# Patient Record
Sex: Male | Born: 1975 | Race: White | Hispanic: No | Marital: Single
Health system: Southern US, Community
[De-identification: ages and names within clinical notes are randomized; demographics above are authoritative.]

---

## 1998-12-26 ENCOUNTER — Encounter: Payer: Self-pay | Admitting: Family Medicine

## 1998-12-26 ENCOUNTER — Ambulatory Visit (HOSPITAL_COMMUNITY): Admission: RE | Admit: 1998-12-26 | Discharge: 1998-12-26 | Payer: Self-pay | Admitting: Family Medicine

## 2004-10-05 ENCOUNTER — Ambulatory Visit (HOSPITAL_COMMUNITY): Admission: RE | Admit: 2004-10-05 | Discharge: 2004-10-05 | Payer: Self-pay | Admitting: Family Medicine

## 2008-10-14 ENCOUNTER — Emergency Department (HOSPITAL_COMMUNITY): Admission: EM | Admit: 2008-10-14 | Discharge: 2008-10-14 | Payer: Self-pay | Admitting: Emergency Medicine

## 2008-11-20 DIAGNOSIS — K625 Hemorrhage of anus and rectum: Secondary | ICD-10-CM

## 2008-11-20 DIAGNOSIS — M549 Dorsalgia, unspecified: Secondary | ICD-10-CM | POA: Insufficient documentation

## 2008-11-21 ENCOUNTER — Ambulatory Visit: Payer: Self-pay | Admitting: Gastroenterology

## 2009-06-11 ENCOUNTER — Encounter (INDEPENDENT_AMBULATORY_CARE_PROVIDER_SITE_OTHER): Payer: Self-pay | Admitting: *Deleted

## 2009-07-10 ENCOUNTER — Ambulatory Visit: Payer: Self-pay | Admitting: Gastroenterology

## 2009-07-10 ENCOUNTER — Encounter (INDEPENDENT_AMBULATORY_CARE_PROVIDER_SITE_OTHER): Payer: Self-pay | Admitting: *Deleted

## 2009-07-15 ENCOUNTER — Encounter (INDEPENDENT_AMBULATORY_CARE_PROVIDER_SITE_OTHER): Payer: Self-pay | Admitting: *Deleted

## 2009-07-15 LAB — CONVERTED CEMR LAB
ALT: 31 units/L (ref 0–53)
AST: 24 units/L (ref 0–37)
Albumin: 4.4 g/dL (ref 3.5–5.2)
Alkaline Phosphatase: 94 units/L (ref 39–117)
BUN: 20 mg/dL (ref 6–23)
Basophils Absolute: 0 10*3/uL (ref 0.0–0.1)
Basophils Relative: 0.3 % (ref 0.0–3.0)
CO2: 29 meq/L (ref 19–32)
Calcium: 9.4 mg/dL (ref 8.4–10.5)
Chloride: 107 meq/L (ref 96–112)
Creatinine, Ser: 1 mg/dL (ref 0.4–1.5)
Eosinophils Absolute: 0.3 10*3/uL (ref 0.0–0.7)
Eosinophils Relative: 3.4 % (ref 0.0–5.0)
GFR calc non Af Amer: 94.47 mL/min (ref >60)
Glucose, Bld: 91 mg/dL (ref 70–99)
HCT: 48.5 % (ref 39.0–52.0)
Hemoglobin: 17 g/dL (ref 13.0–17.0)
Lymphocytes Relative: 30.2 % (ref 12.0–46.0)
Lymphs Abs: 2.3 10*3/uL (ref 0.7–4.0)
MCHC: 35 g/dL (ref 30.0–36.0)
MCV: 92.5 fL (ref 78.0–100.0)
Monocytes Absolute: 0.8 10*3/uL (ref 0.1–1.0)
Monocytes Relative: 10.1 % (ref 3.0–12.0)
Neutro Abs: 4.4 10*3/uL (ref 1.4–7.7)
Neutrophils Relative %: 56 % (ref 43.0–77.0)
Platelets: 208 10*3/uL (ref 150.0–400.0)
Potassium: 4.4 meq/L (ref 3.5–5.1)
RBC: 5.24 M/uL (ref 4.22–5.81)
RDW: 14.3 % (ref 11.5–14.6)
Sed Rate: 2 mm/hr (ref 0–22)
Sodium: 142 meq/L (ref 135–145)
TSH: 0.78 microintl units/mL (ref 0.35–5.50)
Total Bilirubin: 0.6 mg/dL (ref 0.3–1.2)
Total Protein: 7 g/dL (ref 6.0–8.3)
WBC: 7.8 10*3/uL (ref 4.5–10.5)

## 2009-07-21 ENCOUNTER — Telehealth: Payer: Self-pay | Admitting: Gastroenterology

## 2009-08-24 ENCOUNTER — Ambulatory Visit: Payer: Self-pay | Admitting: Gastroenterology

## 2009-08-24 ENCOUNTER — Encounter (INDEPENDENT_AMBULATORY_CARE_PROVIDER_SITE_OTHER): Payer: Self-pay | Admitting: *Deleted

## 2009-08-24 ENCOUNTER — Telehealth: Payer: Self-pay | Admitting: Gastroenterology

## 2009-08-27 ENCOUNTER — Telehealth: Payer: Self-pay | Admitting: Gastroenterology

## 2009-09-03 ENCOUNTER — Emergency Department (HOSPITAL_COMMUNITY): Admission: EM | Admit: 2009-09-03 | Discharge: 2009-09-03 | Payer: Self-pay | Admitting: Emergency Medicine

## 2009-11-23 ENCOUNTER — Ambulatory Visit (HOSPITAL_COMMUNITY): Admission: RE | Admit: 2009-11-23 | Discharge: 2009-11-23 | Payer: Self-pay | Admitting: Psychiatry

## 2009-11-23 ENCOUNTER — Emergency Department (HOSPITAL_COMMUNITY): Admission: EM | Admit: 2009-11-23 | Discharge: 2009-11-24 | Payer: Self-pay | Admitting: Emergency Medicine

## 2009-11-24 ENCOUNTER — Ambulatory Visit: Payer: Self-pay | Admitting: Psychiatry

## 2009-11-24 ENCOUNTER — Inpatient Hospital Stay (HOSPITAL_COMMUNITY): Admission: RE | Admit: 2009-11-24 | Discharge: 2009-11-27 | Payer: Self-pay | Admitting: Psychiatry

## 2010-03-30 NOTE — Letter (Signed)
**Note De-Identified Vavra Obfuscation** Summary: Results Letter  [facility]  [address] [place], [place] [address]   Phone: [phone]  Fax: [phone]        Jul 15, 2009 MRN: [id number]    [facility] [place] [address] [place], [place]  [address]    Dear Mr. Drew Osborne,  We have been unable to reach you by phone regarding your lab results.  Please call at your earliest convenience.  Thank you for your time.        Sincerely,  Chales Abrahams CMA (AAMA)  This letter has been electronically signed by your physician.

## 2010-03-30 NOTE — Assessment & Plan Note (Signed)
**Note De-Identified Northrop Obfuscation** History of Present Illness Visit Type: consult  Primary GI MD: Rob Bunting MD Primary Provider: Rudi Heap, MD Requesting Provider: Rudi Heap, MD Chief Complaint: BRB in stool  History of Present Illness:     pleasant 35 year old man who has red rectal bleeding for 1 1/2 years.  With every BM.  He is not usually constipated, usually has diarrhea.   He had recent labs at PCP office and tells me he was not anemic.    Overall weight has been stable over this time.    No eye findings, no rashes, bumps on skin, shins.   Had some hematemsis 2-3 weeks ago. He will often vomit and gag in the morning. .  Can gag when brushing his teeth.  take vicodin 1-2 time a week.  very rare NSAIDs.              Current Medications (verified): 1)  Vicodin Es 7.5-750 Mg Tabs (Hydrocodone-Acetaminophen) .... Prn 2)  Propranolol Hcl 20 Mg Tabs (Propranolol Hcl) .... 2  Tablet Three Times A Day 3)  Klonopin 0.5 Mg Tabs (Clonazepam) .... One Tablet At Bedtime 4)  Zoloft 50 Mg Tabs (Sertraline Hcl) .... One Tablet By Mouth Once Daily  Allergies (verified): No Known Drug Allergies  Past History:  Past Medical History:  Hypertension ACK PAIN, CHRONIC (ICD-724.5) RECTAL BLEEDING (ICD-569.3) Anxiety Arthritis  Past Surgical History: mastoidectomy 35 years old  Family History: no colon cancer, colon polyps  Social History: Patient currently smokes.  Alcohol Use - yes Illicit Drug Use - denies He is single One daughter  Review of Systems       Pertinent positive and negative review of systems were noted in the above HPI and GI specific review of systems.  All other review of systems was otherwise negative.    Vital Signs:  Patient profile:   35 year old male Height:      74 inches Weight:      205 pounds BMI:     26.42 BSA:     2.20 Pulse rate:   88 / minute Pulse rhythm:   regular BP sitting:   164 / 92  (left arm) Cuff size:   regular  Vitals Entered By: Ok Anis CMA (Jul 10, 2009 2:05 PM)  Physical Exam  Additional Exam:  Constitutional: Very blunted affect Psychiatric: alert and oriented times 3 Eyes: extraocular movements intact, fairly pinpoint pupils Mouth: oropharynx moist, no lesions Neck: supple, no lymphadenopathy Cardiovascular: heart regular rate and rythm Lungs: CTA bilaterally Abdomen: soft, non-tender, non-distended, no obvious ascites, no peritoneal signs, normal bowel sounds Extremities: no lower extremity edema bilaterally Skin: no lesions on visible extremities    Impression & Recommendations:  Problem # 1:  recent rectal bleeding, recent hematemesis he'll need both upper and lower endoscopic procedures to evaluate these complaints. He has a fairly blunted affect and was slow to answer questions with pinpoint pupils. He denied illicit drug use I'm not so sure. He does take Vicodin but says this is on once or twice a week. We'll get a basic set of labs including a CBC, complete metabolic profile, sedimentation rate. He does not at all appear to be anemic and is not hemodynamically compromised.  Other Orders: TLB-CBC Platelet - w/Differential (85025-CBCD) TLB-CMP (Comprehensive Metabolic Pnl) (80053-COMP) TLB-TSH (Thyroid Stimulating Hormone) (84443-TSH) TLB-Sedimentation Rate (ESR) (85652-ESR)  Patient Instructions: 1)  You will be scheduled to have a colonoscopy and EGD. 2)  You will get lab test(s) done today (cbc, **Note De-Identified Mosquera Obfuscation** cmet, esr, tsh). 3)  A copy of this information will be sent to Dr. Christell Constant 4)  The medication list was reviewed and reconciled.  All changed / newly prescribed medications were explained.  A complete medication list was provided to the patient / caregiver.  Appended Document: Orders Update/movi    Clinical Lists Changes  Medications: Added new medication of MOVIPREP 100 GM  SOLR (PEG-KCL-NACL-NASULF-NA ASC-C) As per prep instructions. - Signed Rx of MOVIPREP 100 GM  SOLR (PEG-KCL-NACL-NASULF-NA  ASC-C) As per prep instructions.;  #1 x 0;  Signed;  Entered by: Chales Abrahams CMA (AAMA);  Authorized by: Rachael Fee MD;  Method used: [facility] [phone] [address] [place], [place], [place]  [address], Ph: [phone] or [phone], Fax: [phone] Orders: Added new Test order of Colon/Endo (Colon/Endo) - Signed    Prescriptions: MOVIPREP 100 GM  SOLR (PEG-KCL-NACL-NASULF-NA ASC-C) As per prep instructions.  #1 x 0   Entered by:   Chales Abrahams CMA (AAMA)   Authorized by:   Rachael Fee MD   Signed by:   Chales Abrahams CMA (AAMA) on 07/10/2009   Method used:   [facility]ronically to        [facility] [phone][facility] (retail)       [address]       [place], [place]  [address]       Ph: [phone] or [phone]       Fax: [phone]   RxID:   [id number]

## 2010-03-30 NOTE — Progress Notes (Signed)
**Note De-Identified Fennel Obfuscation** Summary: Lab results  Phone Note Call from Patient Call back at Home Phone [phone]   Caller: Patient Call For: Dr. Christella Hartigan Reason for Call: Lab or Test Results Summary of Call: Calling about lab results Initial call taken by: Karna Christmas,  Jul 21, 2009 8:09 AM  Follow-up for Phone Call        tried to reach pt and the phone is not taking any messages it is full. Chales Abrahams CMA Duncan Dull)  Jul 21, 2009 8:52 AM  phone is still not accepting messages.  Chales Abrahams CMA Duncan Dull)  Jul 22, 2009 2:25 PM   No answer at #'s provided. Dottie Nelson-Smith CMA Duncan Dull)  Jul 24, 2009 10:26 AM

## 2010-03-30 NOTE — Letter (Signed)
**Note De-Identified Hennen Obfuscation** Summary: [facility] Instructions  [facility]  [address] [place], [place] [address]   Phone: [phone]  Fax: [phone]       GREGOIRE BENNIS    07-Oct-1975    MRN: [id number]        Procedure Day /Date:08/24/09  MON     Arrival Time:10 am     Procedure Time:11 am     Location of Procedure:                    X   Endoscopy Center (4th Floor)   PREPARATION FOR COLONOSCOPY WITH MOVIPREP   Starting 5 days prior to your procedure 08/19/09 do not eat nuts, seeds, popcorn, corn, beans, peas,  salads, or any raw vegetables.  Do not take any fiber supplements (e.g. Metamucil, Citrucel, and Benefiber).  THE DAY BEFORE YOUR PROCEDURE         DATE: 08/23/09  DAY: SUN  1.  Drink clear liquids the entire day-NO SOLID FOOD  2.  Do not drink anything colored red or purple.  Avoid juices with pulp.  No orange juice.  3.  Drink at least 64 oz. (8 glasses) of fluid/clear liquids during the day to prevent dehydration and help the prep work efficiently.  CLEAR LIQUIDS INCLUDE: Water Jello Ice Popsicles Tea (sugar ok, no milk/cream) Powdered fruit flavored drinks Coffee (sugar ok, no milk/cream) Gatorade Juice: apple, white grape, white cranberry  Lemonade Clear bullion, consomm, broth Carbonated beverages (any kind) Strained chicken noodle soup Hard Candy                             4.  In the morning, mix first dose of MoviPrep solution:    Empty 1 Pouch A and 1 Pouch B into the disposable container    Add lukewarm drinking water to the top line of the container. Mix to dissolve    Refrigerate (mixed solution should be used within 24 hrs)  5.  Begin drinking the prep at 5:00 p.m. The MoviPrep container is divided by 4 marks.   Every 15 minutes drink the solution down to the next mark (approximately 8 oz) until the full liter is complete.   6.  Follow completed prep with 16 oz of clear liquid of your choice (Nothing red or purple).  Continue to drink clear  liquids until bedtime.  7.  Before going to bed, mix second dose of MoviPrep solution:    Empty 1 Pouch A and 1 Pouch B into the disposable container    Add lukewarm drinking water to the top line of the container. Mix to dissolve    Refrigerate  THE DAY OF YOUR PROCEDURE      DATE: 6/27/11DAY: MON  Beginning at 6 a.m. (5 hours before procedure):         1. Every 15 minutes, drink the solution down to the next mark (approx 8 oz) until the full liter is complete.  2. Follow completed prep with 16 oz. of clear liquid of your choice.    3. You may drink clear liquids until 9 am(2 HOURS BEFORE PROCEDURE).   MEDICATION INSTRUCTIONS  Unless otherwise instructed, you should take regular prescription medications with a small sip of water   as early as possible the morning of your procedure.           OTHER INSTRUCTIONS  You will need a responsible adult at least 18 years of **Note De-Identified Cantu Obfuscation** age to accompany you and drive you home.   This person must remain in the waiting room during your procedure.  Wear loose fitting clothing that is easily removed.  Leave jewelry and other valuables at home.  However, you may wish to bring a book to read or  an iPod/MP3 player to listen to music as you wait for your procedure to start.  Remove all body piercing jewelry and leave at home.  Total time from sign-in until discharge is approximately 2-3 hours.  You should go home directly after your procedure and rest.  You can resume normal activities the  day after your procedure.  The day of your procedure you should not:   Drive   Make legal decisions   Operate machinery   Drink alcohol   Return to work  You will receive specific instructions about eating, activities and medications before you leave.    The above instructions have been reviewed and explained to me by   _______________________    I fully understand and can verbalize these instructions _____________________________ Date  _________

## 2010-03-30 NOTE — Letter (Signed)
**Note De-Identified Kedzierski Obfuscation** Summary: Return to Work  [facility]  [address] [place], [place] [address]   Phone: [phone]  Fax: [phone]    07/10/2009  TO: Leodis Sias IT MAY CONCERN  RE: RASTUS BORTON Sandner [address] [place]   The above named individual was under my medical care on: 07/10/09.    If you have any further questions or need additional information, please call.     Sincerely,  Dr Rob Bunting  typed by: Chales Abrahams CMA (AAMA)

## 2010-03-30 NOTE — Progress Notes (Signed)
**Note De-identified Snuffer Obfuscation**  **Note De-Identified Schuman Obfuscation** Phone Note Outgoing Call   Call placed by: Jennye Boroughs RN,  August 24, 2009 11:07 AM Call placed to: Patient Summary of Call: Attempted to call pt at home and work numbers.  Home number says "cannot be completed as dialed".  Work number was no answer.  Initial call taken by: Jennye Boroughs RN,  August 24, 2009 11:08 AM  Follow-up for Phone Call        Attempted to reach pt. by phone again without success. Follow-up by: Jennye Boroughs RN,  August 24, 2009 11:27 AM  Additional Follow-up for Phone Call Additional follow up Details #1::        Attempted to reach pt by phone again without success.  Will send letter. Additional Follow-up by: Jennye Boroughs RN,  August 24, 2009 12:45 PM

## 2010-03-30 NOTE — Letter (Signed)
**Note De-Identified Eckardt Obfuscation** Summary: [facility] No Show Letter  [facility]  [address] [place], [place] [address]   Phone: [phone]  Fax: [phone]      August 24, 2009 MRN: [id number]   [facility] [place] [address] [place], [place]  [address]      You were scheduled for an endoscopic procedure with Dr.  Christella Hartigan on  08/24/2009 at the [facility] but you did not keep the appointment.    Your provider recommended this procedure for the benefit of your health.  It is very important that you reschedule it.  Failure to do so may be to the detriment of your health.  Please call us at [phone] and we will be happy to assist you with rescheduling.    If you were referred for this procedure by another physician/provider, we will notify him/her that you did not keep your appointment.   Sincerely,   [facility]

## 2010-03-30 NOTE — Progress Notes (Signed)
**Note De-Identified Ficco Obfuscation** Summary: No Show [facility] 08-24-09  Phone Note Call from Patient   Call For: Dr Christella Hartigan Summary of Call: No Show for [facility] on 08-24-09. Are we charging the NO SHOW fee? Dorene Grebe upstairs in the Endo tried calling with no results. Initial call taken by: Leanor Kail [facility],  August 27, 2009 10:56 AM  Follow-up for Phone Call        yes, please charge him Follow-up by: Rachael Fee MD,  August 27, 2009 11:02 AM  Additional Follow-up for Phone Call Additional follow up Details #1::        Patient BILLED. Additional Follow-up by: Leanor Kail [facility],  August 27, 2009 11:05 AM

## 2010-03-30 NOTE — Letter (Signed)
**Note De-Identified Gadway Obfuscation** Summary: New Patient letter  [facility]  [address] [place], [place] [address]   Phone: [phone]  Fax: [phone]       06/11/2009 MRN: [id number]  Drew Osborne [place], [place]  [address]  Dear Drew Osborne,  Welcome to the Gastroenterology Division at [facility].    You are scheduled to see Dr.  Jarold Motto on 07-10-09 at 2pm on the 3rd floor at [facility], [address]  We ask that you try to arrive at our office 15 minutes prior to your appointment time to allow for check-in.  We would like you to complete the enclosed self-administered evaluation form prior to your visit and bring it with you on the day of your appointment.  We will review it with you.  Also, please bring a complete list of all your medications or, if you prefer, bring the medication bottles and we will list them.  Please bring your insurance card so that we may make a copy of it.  If your insurance requires a referral to see a specialist, please bring your referral form from your primary care physician.  Co-payments are due at the time of your visit and may be paid by cash, check or credit card.     Your office visit will consist of a consult with your physician (includes a physical exam), any laboratory testing he/she may order, scheduling of any necessary diagnostic testing (e.g. x-ray, ultrasound, CT-scan), and scheduling of a procedure (e.g. Endoscopy, Colonoscopy) if required.  Please allow enough time on your schedule to allow for any/all of these possibilities.    If you cannot keep your appointment, please call [phone] to cancel or reschedule prior to your appointment date.  This allows [place] the opportunity to schedule an appointment for another patient in need of care.  If you do not cancel or reschedule by 5 p.m. the business day prior to your appointment date, you will be charged a $50.00 late cancellation/no-show fee.    Thank you for choosing [facility] for your medical needs.  We appreciate the opportunity to care for you.  Please visit [place] at our website  to learn more about our practice.                     Sincerely,                                                             The Gastroenterology Division

## 2010-05-13 LAB — BASIC METABOLIC PANEL
CO2: 27 mEq/L (ref 19–32)
Glucose, Bld: 99 mg/dL (ref 70–99)
Potassium: 3.5 mEq/L (ref 3.5–5.1)
Sodium: 140 mEq/L (ref 135–145)

## 2010-05-13 LAB — CBC
HCT: 47.7 % (ref 39.0–52.0)
Hemoglobin: 16.5 g/dL (ref 13.0–17.0)
MCH: 32.6 pg (ref 26.0–34.0)
MCHC: 34.6 g/dL (ref 30.0–36.0)

## 2010-05-13 LAB — RAPID URINE DRUG SCREEN, HOSP PERFORMED
Amphetamines: NOT DETECTED
Barbiturates: NOT DETECTED
Opiates: POSITIVE — AB

## 2010-05-13 LAB — DIFFERENTIAL
Basophils Relative: 1 % (ref 0–1)
Eosinophils Absolute: 0.1 10*3/uL (ref 0.0–0.7)
Monocytes Absolute: 0.5 10*3/uL (ref 0.1–1.0)
Monocytes Relative: 7 % (ref 3–12)

## 2010-06-05 LAB — DIFFERENTIAL
Eosinophils Relative: 3 % (ref 0–5)
Lymphocytes Relative: 25 % (ref 12–46)
Lymphs Abs: 1.8 10*3/uL (ref 0.7–4.0)
Neutro Abs: 4.6 10*3/uL (ref 1.7–7.7)

## 2010-06-05 LAB — COMPREHENSIVE METABOLIC PANEL
AST: 22 U/L (ref 0–37)
Albumin: 3.9 g/dL (ref 3.5–5.2)
CO2: 26 mEq/L (ref 19–32)
Calcium: 8.9 mg/dL (ref 8.4–10.5)
Creatinine, Ser: 0.82 mg/dL (ref 0.4–1.5)
GFR calc Af Amer: >60 mL/min (ref >60)
GFR calc non Af Amer: >60 mL/min (ref >60)

## 2010-06-05 LAB — CBC
MCHC: 35.1 g/dL (ref 30.0–36.0)
MCV: 93.3 fL (ref 78.0–100.0)
Platelets: 180 10*3/uL (ref 150–400)

## 2011-03-22 IMAGING — CR DG CHEST 2V
2 series · 2 of 2 positions shown · non-contrast
Comparison: None.

CLINICAL DATA: Chest pain, back pain.

CHEST - 2 VIEW

[w chest pa]
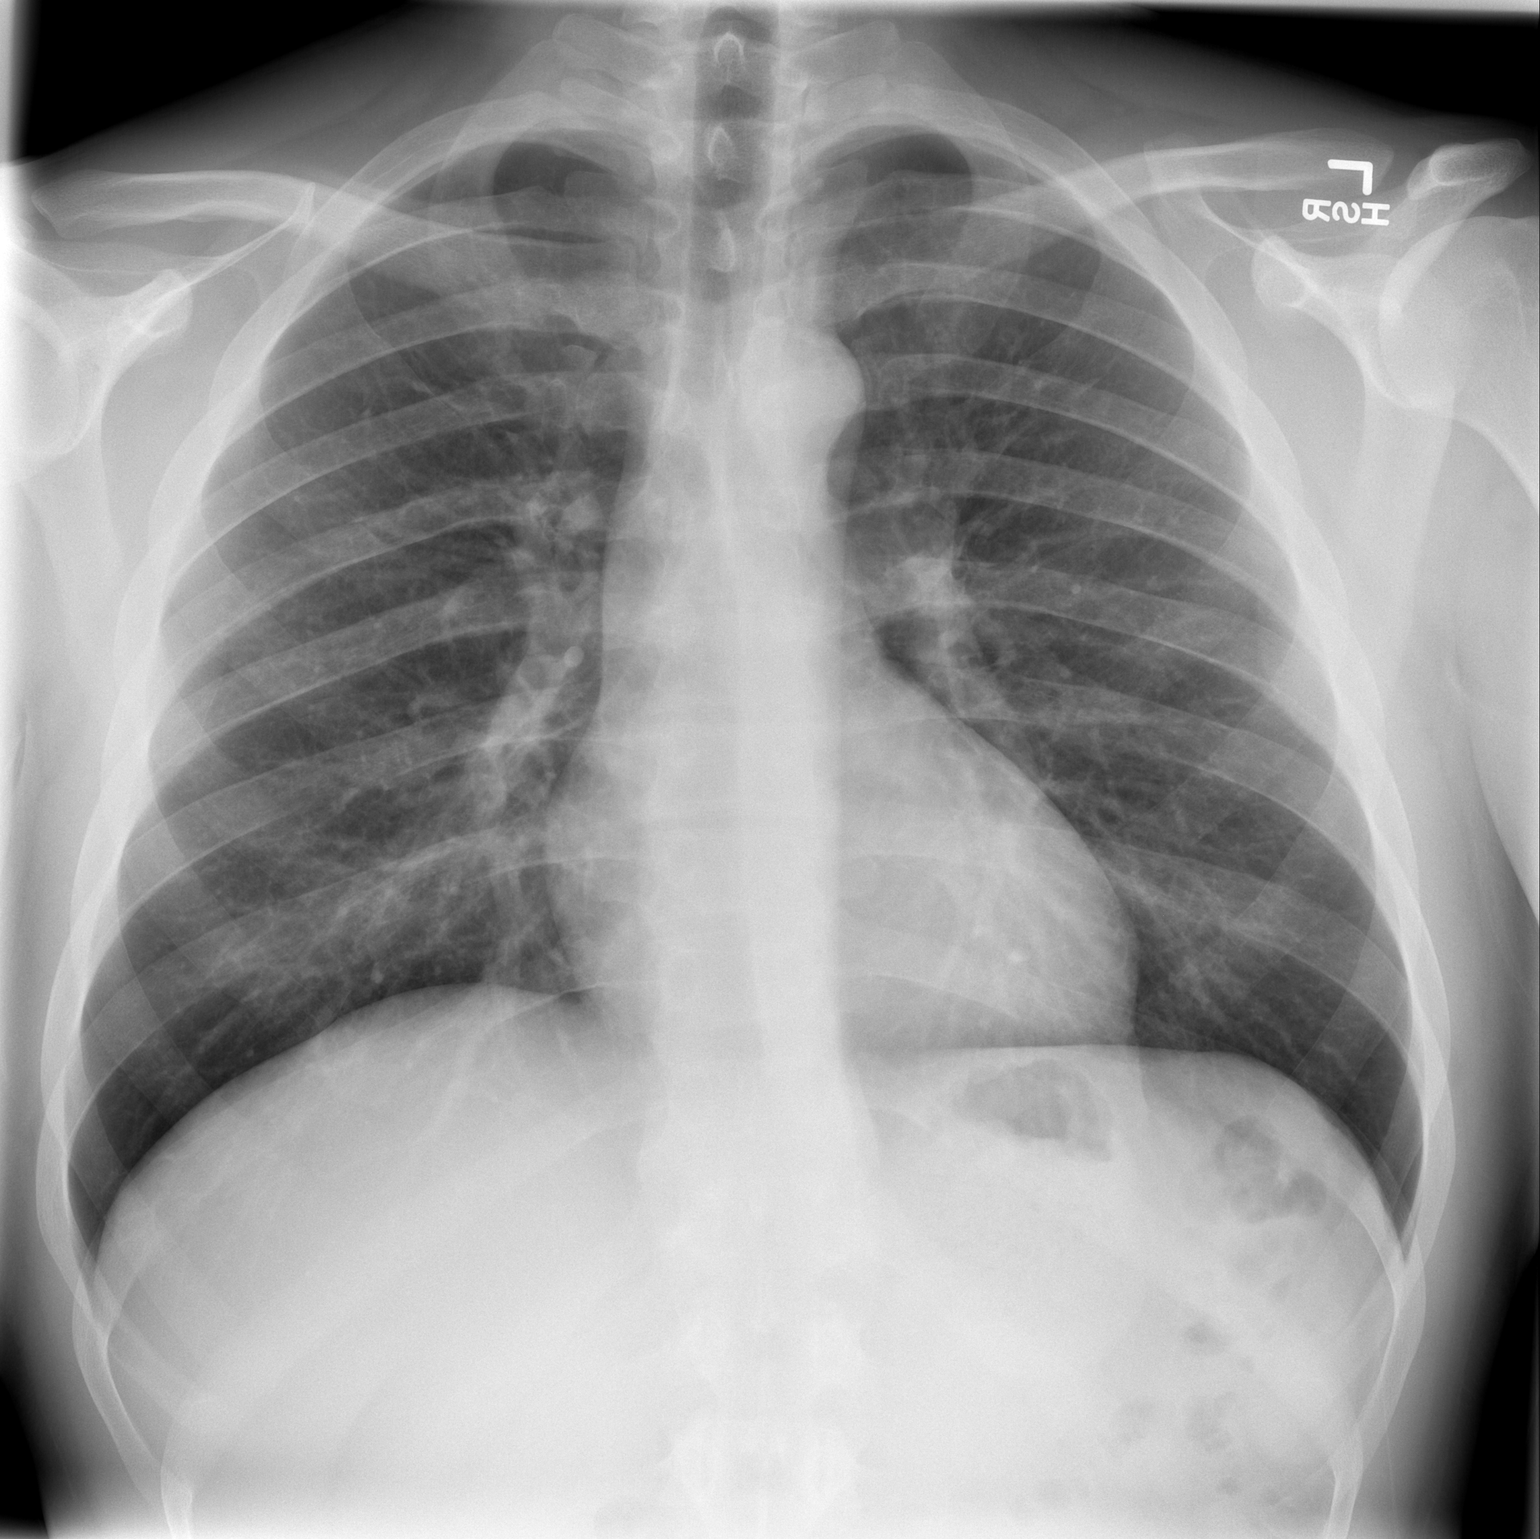

[w chest lat]
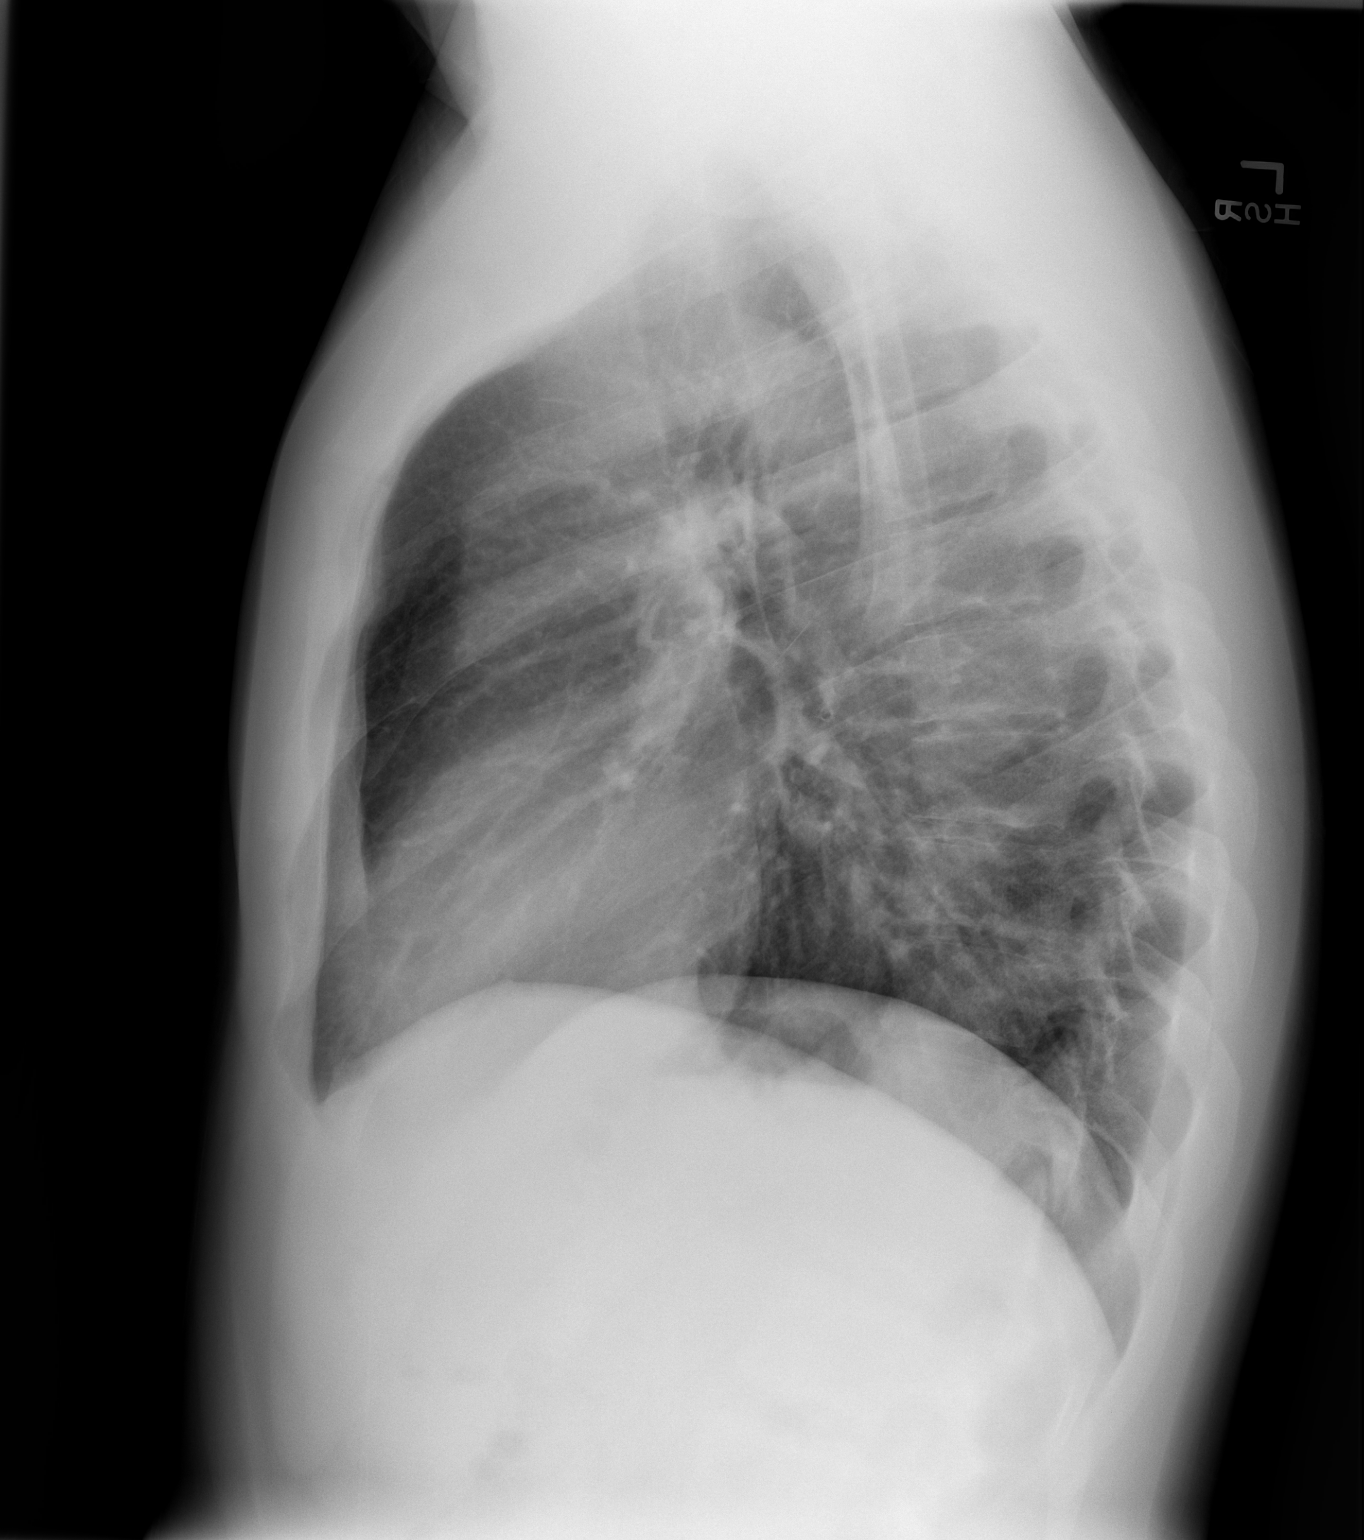

[2 of 2 positions shown; findings below may reference images not displayed]

FINDINGS: Trachea is midline.  Heart size normal.  Lungs are clear.
No pleural fluid.
IMPRESSION: No acute findings.

## 2018-12-05 ENCOUNTER — Other Ambulatory Visit: Payer: Self-pay

## 2018-12-05 DIAGNOSIS — Z20822 Contact with and (suspected) exposure to covid-19: Secondary | ICD-10-CM

## 2018-12-07 LAB — NOVEL CORONAVIRUS, NAA: SARS-CoV-2, NAA: NOT DETECTED

## 2019-01-04 ENCOUNTER — Other Ambulatory Visit: Payer: Self-pay

## 2019-01-04 DIAGNOSIS — Z20822 Contact with and (suspected) exposure to covid-19: Secondary | ICD-10-CM

## 2019-01-05 LAB — NOVEL CORONAVIRUS, NAA: SARS-CoV-2, NAA: NOT DETECTED

## 2019-05-09 ENCOUNTER — Ambulatory Visit: Payer: BC Managed Care – PPO | Attending: Internal Medicine

## 2019-05-09 DIAGNOSIS — Z23 Encounter for immunization: Secondary | ICD-10-CM

## 2019-05-09 NOTE — Progress Notes (Signed)
**Note De-identified Dansereau Obfuscation**   **Note De-Identified Birnie Obfuscation** Covid-19 Vaccination Clinic  Name:  Renny Remer Hebel    MRN: [id number] DOB: 15-Jun-1975  05/09/2019  Mr. Doster was observed post Covid-19 immunization for 15 minutes without incident. He was provided with Vaccine Information Sheet and instruction to access the V-Safe system.   Mr. Poehlman was instructed to call 911 with any severe reactions post vaccine: Marland Kitchen Difficulty breathing  . Swelling of face and throat  . A fast heartbeat  . A bad rash all over body  . Dizziness and weakness   Immunizations Administered    Name Date Dose VIS Date Route   Pfizer COVID-19 Vaccine 05/09/2019  2:10 PM 0.3 mL 02/08/2019 Intramuscular   Manufacturer: [employer], [employer]   Lot: [id number]   NDC: [address]-1

## 2019-05-27 ENCOUNTER — Other Ambulatory Visit: Payer: Self-pay

## 2019-05-27 ENCOUNTER — Ambulatory Visit: Payer: BC Managed Care – PPO | Admitting: Podiatry

## 2019-05-27 ENCOUNTER — Ambulatory Visit (INDEPENDENT_AMBULATORY_CARE_PROVIDER_SITE_OTHER): Payer: BC Managed Care – PPO

## 2019-05-27 VITALS — Temp 97.3°F

## 2019-05-27 DIAGNOSIS — L989 Disorder of the skin and subcutaneous tissue, unspecified: Secondary | ICD-10-CM

## 2019-05-27 DIAGNOSIS — M21621 Bunionette of right foot: Secondary | ICD-10-CM | POA: Diagnosis not present

## 2019-05-27 DIAGNOSIS — M898X7 Other specified disorders of bone, ankle and foot: Secondary | ICD-10-CM

## 2019-05-27 DIAGNOSIS — M21612 Bunion of left foot: Secondary | ICD-10-CM | POA: Diagnosis not present

## 2019-05-27 DIAGNOSIS — M21611 Bunion of right foot: Secondary | ICD-10-CM

## 2019-05-27 NOTE — Patient Instructions (Signed)
**Note De-identified Mearns Obfuscation** Pre-Operative Instructions  Congratulations, you have decided to take an important step towards improving your quality of life.  You can be assured that the doctors and staff at [facility] will be with you every step of the way.  Here are some important things you should know:  1. Plan to be at the surgery center/hospital at least 1 (one) hour prior to your scheduled time, unless otherwise directed by the surgical center/hospital staff.  You must have a responsible adult accompany you, remain during the surgery and drive you home.  Make sure you have directions to the surgical center/hospital to ensure you arrive on time. 2. If you are having surgery at [facility] or [facility], you will need a copy of your medical history and physical form from your family physician within one month prior to the date of surgery. We will give you a form for your primary physician to complete.  3. We make every effort to accommodate the date you request for surgery.  However, there are times where surgery dates or times have to be moved.  We will contact you as soon as possible if a change in schedule is required.   4. No aspirin/ibuprofen for one week before surgery.  If you are on aspirin, any non-steroidal anti-inflammatory medications (Mobic, Aleve, Ibuprofen) should not be taken seven (7) days prior to your surgery.  You make take Tylenol for pain prior to surgery.  5. Medications - If you are taking daily heart and blood pressure medications, seizure, reflux, allergy, asthma, anxiety, pain or diabetes medications, make sure you notify the surgery center/hospital before the day of surgery so they can tell you which medications you should take or avoid the day of surgery. 6. No food or drink after midnight the night before surgery unless directed otherwise by surgical center/hospital staff. 7. No alcoholic beverages 24-hours prior to surgery.  No smoking 24-hours prior or 24-hours after  surgery. 8. Wear loose pants or shorts. They should be loose enough to fit over bandages, boots, and casts. 9. Don't wear slip-on shoes. Sneakers are preferred. 10. Bring your boot with you to the surgery center/hospital.  Also bring crutches or a walker if your physician has prescribed it for you.  If you do not have this equipment, it will be provided for you after surgery. 11. If you have not been contacted by the surgery center/hospital by the day before your surgery, call to confirm the date and time of your surgery. 12. Leave-time from work may vary depending on the type of surgery you have.  Appropriate arrangements should be made prior to surgery with your employer. 13. Prescriptions will be provided immediately following surgery by your doctor.  Fill these as soon as possible after surgery and take the medication as directed. Pain medications will not be refilled on weekends and must be approved by the doctor. 14. Remove nail polish on the operative foot and avoid getting pedicures prior to surgery. 15. Wash the night before surgery.  The night before surgery wash the foot and leg well with water and the antibacterial soap provided. Be sure to pay special attention to beneath the toenails and in between the toes.  Wash for at least three (3) minutes. Rinse thoroughly with water and dry well with a towel.  Perform this wash unless told not to do so by your physician.  Enclosed: 1 Ice pack (please put in freezer the night before surgery)   1 Hibiclens skin cleaner    **Note De-identified Bunker Obfuscation** Pre-op instructions  If you have any questions regarding the instructions, please do not hesitate to call our office.  [place]: [address], [place], [place] [address] -- [phone]  [place]: [address] [place], [place] [address] -- [phone]  [place]: [address], [place], [place] [address] -- [phone]   Website: [email or web address] 

## 2019-05-29 NOTE — Progress Notes (Signed)
**Note De-identified Siemen Obfuscation**   **Note De-Identified Baiza Obfuscation** Subjective: 44 y.o. 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 He reports severe pain that is exacerbated by walking. Resting the feet help alleviate his symptoms. He has been conservatively treated by Dr. Elijah Birk for his complaint. Patient is here for further evaluation and treatment.  No past medical history on file.   Objective: Physical Exam General: The patient is alert and oriented x3 in no acute distress.  Dermatology: Hyperkeratotic lesion(s) present on the bilateral feet. Pain on palpation with a central nucleated core noted. Skin is cool, dry and supple bilateral lower extremities.   Vascular: Palpable pedal pulses bilaterally. No edema or erythema noted. Capillary refill within normal limits.  Neurological: Epicritic and protective threshold grossly intact bilaterally.   Musculoskeletal Exam: Clinical evidence of Tailor's bunion deformity noted to the respective foot. There is a moderate pain on palpation range of motion of the fifth MPJ. Exostosis noted to the left hallux.   Radiographic Exam: Increased intermetatarsal angle to the fourth interspace of the respective foot. Prominent fifth metatarsal head. Joint spaces preserved. Exostosis noted to the left hallux.   Assessment: 1. Tailor's bunion deformity right 2. Exostosis left hallux 3. Pre-ulcerative callus lesions noted to the bilateral feet x 4   Plan of Care:  1. Patient was evaluated. X-Rays reviewed.  2. Today we discussed the conservative versus surgical management of the presenting pathology. The patient opts for surgical management. All possible complications and details of the procedure were explained. All patient questions were answered. No guarantees were expressed or implied. 3. Authorization for surgery was initiated today. Surgery will consist of Tailor's bunionectomy right with osteotomy; exostectomy left  hallux.  4. Powerstep insoles provided to patient.  5. Excisional debridement of keratotic lesion(s) using a chisel blade was performed without incident. Light dressing applied.  6. Return to clinic one week post op.    Works for [employer].       Felecia Shelling, DPM [facility]  Dr. Felecia Shelling, DPM    [address]                                        [place], [place] [address]                Office [phone]  Fax [phone]

## 2019-06-03 ENCOUNTER — Ambulatory Visit: Payer: BC Managed Care – PPO | Attending: Internal Medicine

## 2019-06-03 DIAGNOSIS — Z23 Encounter for immunization: Secondary | ICD-10-CM

## 2019-06-03 NOTE — Progress Notes (Signed)
**Note De-identified [facility]ipson Obfuscation**   **Note De-Identified [place] Obfuscation** Covid[facility] Vaccination [facility]  Name:  Drew Osborne    MRN: [id number] DOB: 1975-03-25  06/03/2019  [name] was observed post Covid-19 immunization for 15 minutes without incident. He was provided with Vaccine Information Sheet and instruction to access the V-Safe system.   [name] was instructed to call 911 with any severe reactions post vaccine: Marland Kitchen Difficulty breathing  . Swelling of face and throat  . A fast heartbeat  . A bad rash all over body  . Dizziness and weakness   Immunizations Administered    Name Date Dose VIS Date Route   Pfizer COVID-19 Vaccine 06/03/2019 10:31 AM 0.3 mL 02/08/2019 Intramuscular   Manufacturer: [employer], [employer]   Lot: [id number]   NDC: [address]-5
